# Patient Record
Sex: Male | Born: 1990 | Race: Black or African American | Hispanic: No | Marital: Single | Smoking: Current some day smoker
Health system: Southern US, Community
[De-identification: ages and names within clinical notes are randomized; demographics above are authoritative.]

## PROBLEM LIST (undated history)

## (undated) HISTORY — PX: KNEE SURGERY: SHX244

---
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
## 2017-06-14 ENCOUNTER — Encounter (HOSPITAL_COMMUNITY): Payer: Self-pay | Admitting: Emergency Medicine

## 2017-06-14 ENCOUNTER — Emergency Department (HOSPITAL_COMMUNITY): Payer: Self-pay

## 2017-06-14 ENCOUNTER — Emergency Department (HOSPITAL_COMMUNITY)
Admission: EM | Admit: 2017-06-14 | Discharge: 2017-06-15 | Disposition: A | Discharge status: Home / Self Care | Payer: Self-pay | Attending: Emergency Medicine | Admitting: Emergency Medicine

## 2017-06-14 DIAGNOSIS — M25361 Other instability, right knee: Secondary | ICD-10-CM | POA: Insufficient documentation

## 2017-06-14 DIAGNOSIS — F17200 Nicotine dependence, unspecified, uncomplicated: Secondary | ICD-10-CM | POA: Insufficient documentation

## 2017-06-14 NOTE — Discharge Instructions (Signed)
Please read and follow all provided instructions.  You have been seen today for right knee instability  Tests performed today include: An x-ray of the affected area - does NOT show any broken bones or dislocations.  Your exam did not show injury to your ACL, PCL, MCL, LCL, or meniscus.  Vital signs. See below for your results today.   Home care instructions: -- *PRICE in the first 24-48 hours after injury: Protect (with brace, splint, sling), if given by your provider Rest Ice- Do not apply ice pack directly to your skin, place towel or similar between your skin and ice/ice pack. Apply ice for 20 min, then remove for 40 min while awake Compression- Wear brace, elastic bandage, splint as directed by your provider Elevate affected extremity above the level of your heart when not walking around for the first 24-48 hours   Use Ibuprofen (Motrin/Advil)  every 6 hours as needed for pain (do not exceed max dose in 24 hours, )  Follow-up instructions: Please follow-up with your primary care provider or the provided orthopedic physician (bone specialist) if you continue to have significant pain in 1 week. In this case you may have a more severe injury that requires further care.   Return instructions:  Please return if your toes or feet are numb or tingling, appear gray or blue, or you have severe pain (also elevate the leg and loosen splint or wrap if you were given one) Please return to the Emergency Department if you experience worsening symptoms.  Please return if you have any other emergent concerns. Additional Information:  Your vital signs today were: BP (!) 148/96 (BP Location: Right Arm)    Pulse 79    Resp 16    Ht  (1.778 m)    Wt 127 kg (280 lb)    SpO2 97%    BMI 40.18 kg/m  If your blood pressure (BP) was elevated above 135/85 this visit, please have this repeated by your doctor within one month. ---------------

## 2017-06-14 NOTE — ED Provider Notes (Signed)
[facility]-EMERGENCY DEPT Provider Note   CSN: [id number] Arrival date & time: 06/14/17  2033     History   Chief Complaint Chief Complaint  Patient presents with  . Knee Pain  . Knee Injury    HPI Kevin Mason is a 26 y.o. male who presents to the emergency department today for 1 week of right knee pain. The patient states that he was "falling around" and jumped backwards last week and felt a small pop in his right knee. He felt the knee has been unstable and like it was "sliding" ever since especially with walking upstairs. He says this same sensation happened to him when he tore his anterior cruciate ligament of the left knee previously while playing football. The patient is not in any current pain, just notes the instability. He has not taken anything for this. He denies any numbness, tingling, weakness of the lower extremity or difficulty with gait.    HPI  History reviewed. No pertinent past medical history.  There are no active problems to display for this patient.   Past Surgical History:  Procedure Laterality Date  . KNEE SURGERY Left        Home Medications    Prior to Admission medications   Not on File    Family History History reviewed. No pertinent family history.  Social History Social History  Substance Use Topics  . Smoking status: Current Some Day Smoker  . Smokeless tobacco: Never Used  . Alcohol use No     Allergies   Shrimp [shellfish allergy]   Review of Systems Review of Systems  Constitutional: Negative for fever.  Musculoskeletal: Negative for gait problem and joint swelling.       Instability  Skin: Negative for color change.  Neurological: Negative for weakness and numbness.     Physical Exam Updated Vital Signs BP (!) 148/96 (BP Location: Right Arm)   Pulse 79   Resp 16   Ht  (1.778 m)   Wt 127 kg (280 lb)   SpO2 97%   BMI 40.18 kg/m   Physical Exam  Constitutional: He appears well-developed and  well-nourished.  HENT:  Head: Normocephalic and atraumatic.  Right Ear: External ear normal.  Left Ear: External ear normal.  Eyes: Conjunctivae are normal. Right eye exhibits no discharge. Left eye exhibits no discharge. No scleral icterus.  Cardiovascular:  Pulses:      Dorsalis pedis pulses are 2+ on the right side, and 2+ on the left side.       Posterior tibial pulses are 2+ on the right side, and 2+ on the left side.  Pulmonary/Chest: Effort normal. No respiratory distress.  Musculoskeletal:  Appearance normal. No obvious deformity. No skin swelling, erythema, heat, fluctuance or break of the skin. No TTP. Active and passive flexion and extension intact without pain or crepitus. Negative Lachman's test. Negative anterior/poster drawer bilaterally. Negative ballottement test. Negative McMurray's test. No varus or valgus laxity or locking. No TTP of knees or ankles. Compartments soft. Neurovascularly intact distally to site of injury.   Neurological: He is alert. No sensory deficit. Gait normal.  Skin: Skin is warm and dry. No erythema. No pallor.  Psychiatric: He has a normal mood and affect.  Nursing note and vitals reviewed.    ED Treatments / Results  Labs (all labs ordered are listed, but only abnormal results are displayed) Labs Reviewed - No data to display  EKG  EKG Interpretation None  Radiology Dg Knee Complete 4 Views Right  Result Date: 06/14/2017 CLINICAL DATA:  Twisted the right knee, pain EXAM: RIGHT KNEE - COMPLETE 4+ VIEW COMPARISON:  None. FINDINGS: No evidence of fracture, dislocation, or joint effusion. No evidence of arthropathy or other focal bone abnormality. Soft tissues are unremarkable. IMPRESSION: Negative. Electronically Signed   By: Jasmine Pang M.D.   On: 06/14/2017 22:24    Procedures Procedures (including critical care time)  Medications Ordered in ED Medications - No data to display   Initial Impression / Assessment and Plan /  ED Course  I have reviewed the triage vital signs and the nursing notes.  Pertinent labs & imaging results that were available during my care of the patient were reviewed by me and considered in my medical decision making (see chart for details).     Patient here for 1 week of right knee instability. Patient X-Ray negative for obvious fracture or dislocation. No obvious ligamentous injury with focus on the anterior cruciate ligament. No evidence of meniscus injury. Patient is pain-free in the department. I'll provide him with a knee sleeve and advised to follow up with orthopedics if symptoms persist for possibility of missed fracture diagnosis. Conservative therapy recommended and discussed. Patient will be dc home & is agreeable with above plan.   Final Clinical Impressions(s) / ED Diagnoses   Final diagnoses:  Knee instability, right    New Prescriptions New Prescriptions   No medications on file     Princella Pellegrini 06/14/17 2354    Alvira Monday, MD 06/15/17 [phone]

## 2017-06-14 NOTE — ED Triage Notes (Signed)
Patient arrives with complaint of right knee pain. States onset 1 week ago after jumping backwards. States it he felt a small pop and then his knee felt unstable like it was sliding. The knee was then unstable and he fell over. States he has history of previous ACL injury to left knee and this feel similar.

## 2017-06-15 NOTE — ED Notes (Signed)
Ortho tech called for knee sleeve.  

## 2017-08-04 ENCOUNTER — Other Ambulatory Visit: Payer: Self-pay

## 2017-08-04 ENCOUNTER — Emergency Department
Admission: EM | Admit: 2017-08-04 | Discharge: 2017-08-05 | Disposition: A | Discharge status: Home / Self Care | Payer: Self-pay | Attending: Emergency Medicine | Admitting: Emergency Medicine

## 2017-08-04 DIAGNOSIS — S61217A Laceration without foreign body of left little finger without damage to nail, initial encounter: Secondary | ICD-10-CM | POA: Insufficient documentation

## 2017-08-04 DIAGNOSIS — Y999 Unspecified external cause status: Secondary | ICD-10-CM | POA: Insufficient documentation

## 2017-08-04 DIAGNOSIS — Y929 Unspecified place or not applicable: Secondary | ICD-10-CM | POA: Insufficient documentation

## 2017-08-04 DIAGNOSIS — W268XXA Contact with other sharp object(s), not elsewhere classified, initial encounter: Secondary | ICD-10-CM | POA: Insufficient documentation

## 2017-08-04 DIAGNOSIS — Y939 Activity, unspecified: Secondary | ICD-10-CM | POA: Insufficient documentation

## 2017-08-04 DIAGNOSIS — F17200 Nicotine dependence, unspecified, uncomplicated: Secondary | ICD-10-CM | POA: Insufficient documentation

## 2017-08-04 DIAGNOSIS — S61012A Laceration without foreign body of left thumb without damage to nail, initial encounter: Secondary | ICD-10-CM | POA: Insufficient documentation

## 2017-08-04 MED ORDER — IBUPROFEN 600 MG PO TABS: 600.0000 mg | ORAL_TABLET | Freq: Three times a day (TID) | ORAL | 0 refills | Status: AC | PRN

## 2017-08-04 MED ORDER — LIDOCAINE HCL (PF) 1 % IJ SOLN
INTRAMUSCULAR | Status: AC
  Filled 2017-08-04: qty 5

## 2017-08-04 MED ORDER — TRAMADOL HCL 50 MG PO TABS: 50.0000 mg | ORAL_TABLET | Freq: Four times a day (QID) | ORAL | 0 refills | Status: AC | PRN

## 2017-08-04 MED ORDER — IBUPROFEN 800 MG PO TABS
800.0000 mg | ORAL_TABLET | Freq: Once | ORAL | Status: AC
  Administered 2017-08-04: 800 mg via ORAL
  Filled 2017-08-04: qty 1

## 2017-08-04 MED ORDER — BACITRACIN ZINC 500 UNIT/GM EX OINT
TOPICAL_OINTMENT | Freq: Once | CUTANEOUS | Status: AC
  Administered 2017-08-04: 15:00:00 via TOPICAL
  Filled 2017-08-04: qty 0.9

## 2017-08-04 NOTE — ED Notes (Signed)
Blood draw completed in triage for officers

## 2017-08-04 NOTE — ED Notes (Signed)
Pt left hand soaking in betadine and saline solution per PA instructions
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
## 2017-08-04 NOTE — Discharge Instructions (Addendum)
° °  Follow  discharge Instructions and have sutures removed in 10 days. May take ibuprofen for pain.

## 2017-08-14 ENCOUNTER — Emergency Department
Admission: EM | Admit: 2017-08-14 | Discharge: 2017-08-14 | Disposition: A | Discharge status: Home / Self Care | Payer: Self-pay | Attending: Emergency Medicine | Admitting: Emergency Medicine

## 2017-08-14 ENCOUNTER — Other Ambulatory Visit: Payer: Self-pay

## 2017-08-14 ENCOUNTER — Encounter: Payer: Self-pay | Admitting: Emergency Medicine

## 2017-08-14 DIAGNOSIS — Z4802 Encounter for removal of sutures: Secondary | ICD-10-CM

## 2017-08-14 DIAGNOSIS — S61012D Laceration without foreign body of left thumb without damage to nail, subsequent encounter: Secondary | ICD-10-CM | POA: Insufficient documentation

## 2017-08-14 DIAGNOSIS — F17200 Nicotine dependence, unspecified, uncomplicated: Secondary | ICD-10-CM | POA: Insufficient documentation

## 2017-08-14 DIAGNOSIS — X58XXXD Exposure to other specified factors, subsequent encounter: Secondary | ICD-10-CM | POA: Insufficient documentation

## 2017-08-14 NOTE — Discharge Instructions (Signed)
Keep the wound clean, dry, and covered.  °

## 2017-08-14 NOTE — ED Notes (Signed)
Pt here for suture removal x10 days ago to RT thumb and pinky finger. No drainage or swelling noted.

## 2017-08-14 NOTE — ED Triage Notes (Signed)
Patient ambulatory to triage with steady gait, without difficulty or distress noted; pt reports here for stitch removal to left thumb

## 2017-08-14 NOTE — ED Notes (Signed)
Sutures removed at this time, wounds clean and dry . No bleeding or signs of infection. Wrapped with gauze at this time to keep covered. Pt informed to watch for signs of infections

## 2017-08-14 NOTE — ED Triage Notes (Signed)
Arrives for suture removal.  Wound clean and dry.  Well approximated.

## 2017-08-15 NOTE — ED Provider Notes (Signed)
[facility] Emergency Department Provider Note ____________________________________________  Time seen: 2002  I have reviewed the triage vital signs and the nursing notes.  HISTORY  Chief Complaint  Suture / Staple Removal  HPI Kevin Mason is a 26 y.o. male presented to the ED for suture removal.  Patient was seen in the ED 10 days prior for a laceration to his left thumb and left pinky.  He denies any interim complaints.  History reviewed. No pertinent past medical history.  There are no active problems to display for this patient.  Past Surgical History:  Procedure Laterality Date  . KNEE SURGERY Left     Prior to Admission medications   Medication Sig Start Date End Date Taking? Authorizing Provider  ibuprofen (ADVIL,MOTRIN) 600 MG tablet Take 1 tablet (600 mg total) every 8 (eight) hours as needed by mouth. 08/04/17   Joni ReiningSmith, Ronald K, PA-C  traMADol (ULTRAM) 50 MG tablet Take 1 tablet (50 mg total) every 6 (six) hours as needed by mouth for moderate pain. 08/04/17   Joni ReiningSmith, Ronald K, PA-C   Allergies Shrimp [shellfish allergy]  No family history on file.  Social History Social History   Tobacco Use  . Smoking status: Current Some Day Smoker  . Smokeless tobacco: Never Used  Substance Use Topics  . Alcohol use: No  . Drug use: No    Review of Systems  Constitutional: Negative for fever. Skin: Negative for rash. Finger lacerations s/p suture repair Neurological: Negative for headaches, focal weakness or numbness. ____________________________________________  PHYSICAL EXAM:  VITAL SIGNS: ED Triage Vitals  Enc Vitals Group     BP 08/14/17 1912 140/77     Pulse Rate 08/14/17 1912 79     Resp 08/14/17 1912 20     Temp 08/14/17 1912 97.9 F (36.6 C)     Temp Source 08/14/17 1912 Oral     SpO2 08/14/17 1912 98 %     Weight --      Height --      Head Circumference --      Peak Flow --      Pain Score 08/14/17 1942 0     Pain Loc  --      Pain Edu? --      Excl. in GC? --     Constitutional: Alert and oriented. Well appearing and in no distress. Head: Normocephalic and atraumatic. Cardiovascular:  Normal distal pulses. Respiratory: Normal respiratory effort.  Musculoskeletal: Normal composite fist. Nontender with normal range of motion in all extremities.  Neurologic:  Normal gait without ataxia. Normal speech and language. No gross focal neurologic deficits are appreciated. Skin:  Skin is warm, dry and intact. No rash noted. ____________________________________________  PROCEDURES  .Suture Removal Date/Time: 08/15/2017 12:30 AM Performed by: Loye, SwazilandJordan E, RN Authorized by: Lissa HoardMenshew, Caramia Boutin V Bacon, PA-C   Consent:    Consent obtained:  Verbal   Consent given by:  Patient Location:    Location:  Upper extremity   Upper extremity location:  Hand   Hand location:  L thumb and L small finger Procedure details:    Wound appearance:  No signs of infection, good wound healing and clean   Number of sutures removed:  7 Post-procedure details:    Post-removal:  Band-Aid applied   Patient tolerance of procedure:  Tolerated well, no immediate complications  ___________________________________________  INITIAL IMPRESSION / ASSESSMENT AND PLAN / ED COURSE  Patient with a ED encounter for suture removals.  Patient  had sutures placed to the left thumb and small finger.  Wounds are well-healed without any signs of infection.  Sutures are removed without difficulty and dry dressings are placed.  Patient is given wound care instructions and will follow up with his primary care provider as needed. ____________________________________________  FINAL CLINICAL IMPRESSION(S) / ED DIAGNOSES  Final diagnoses:  Encounter for removal of sutures      Karmen StabsMenshew, Charlesetta IvoryJenise V Bacon, PA-C 08/15/17 0031    Minna AntisPaduchowski, Kevin, MD 08/18/17 1443

## 2018-04-03 IMAGING — DX DG KNEE COMPLETE 4+V*R*
4 series · 4 of 4 positions shown · non-contrast
Comparison: None.

CLINICAL DATA: Twisted the right knee, pain

EXAM:
RIGHT KNEE - COMPLETE 4+ VIEW

[knee ap]
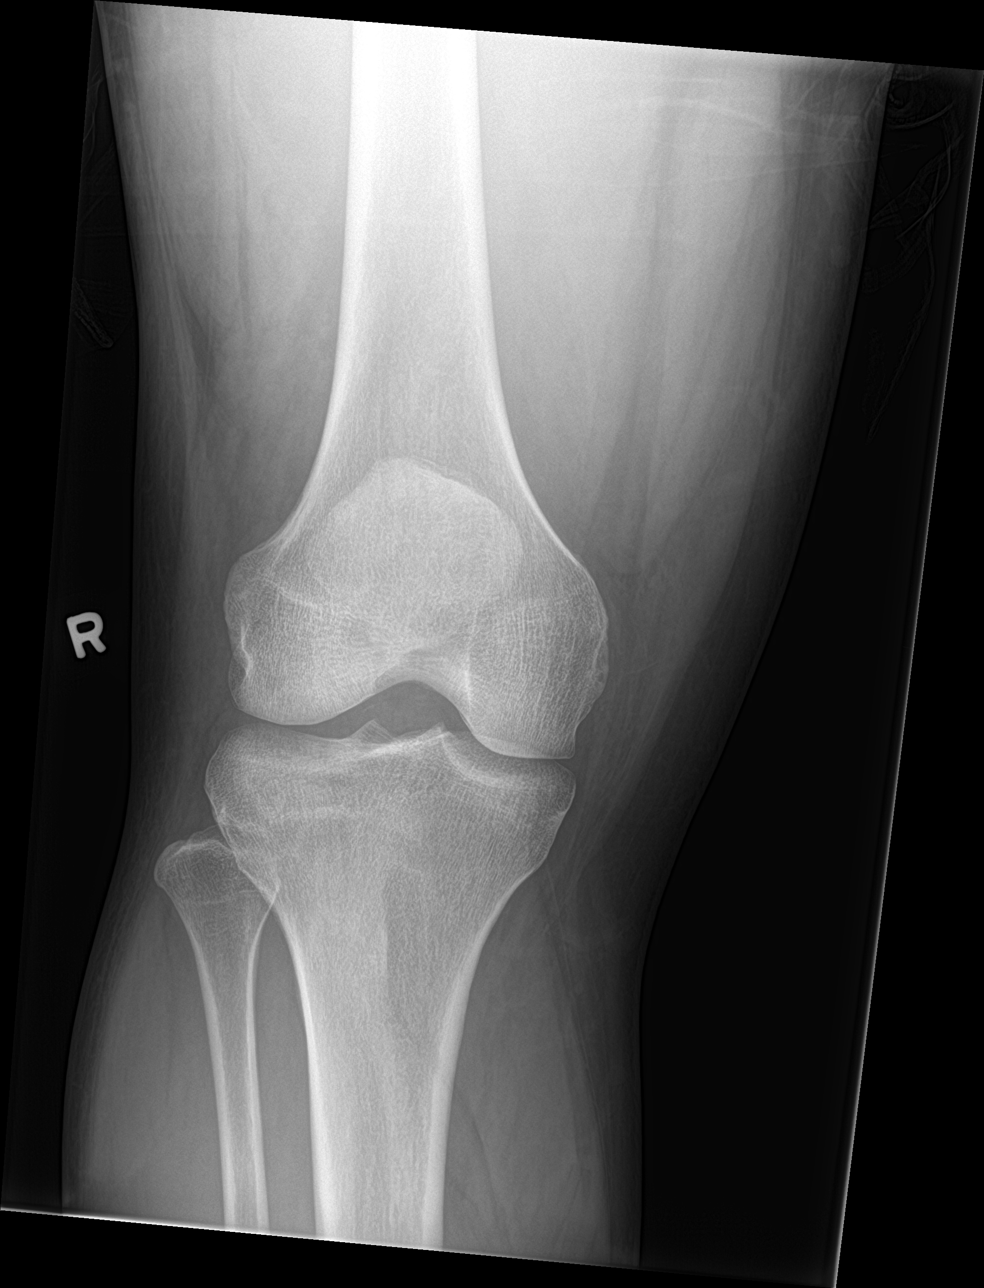

[knee lat]
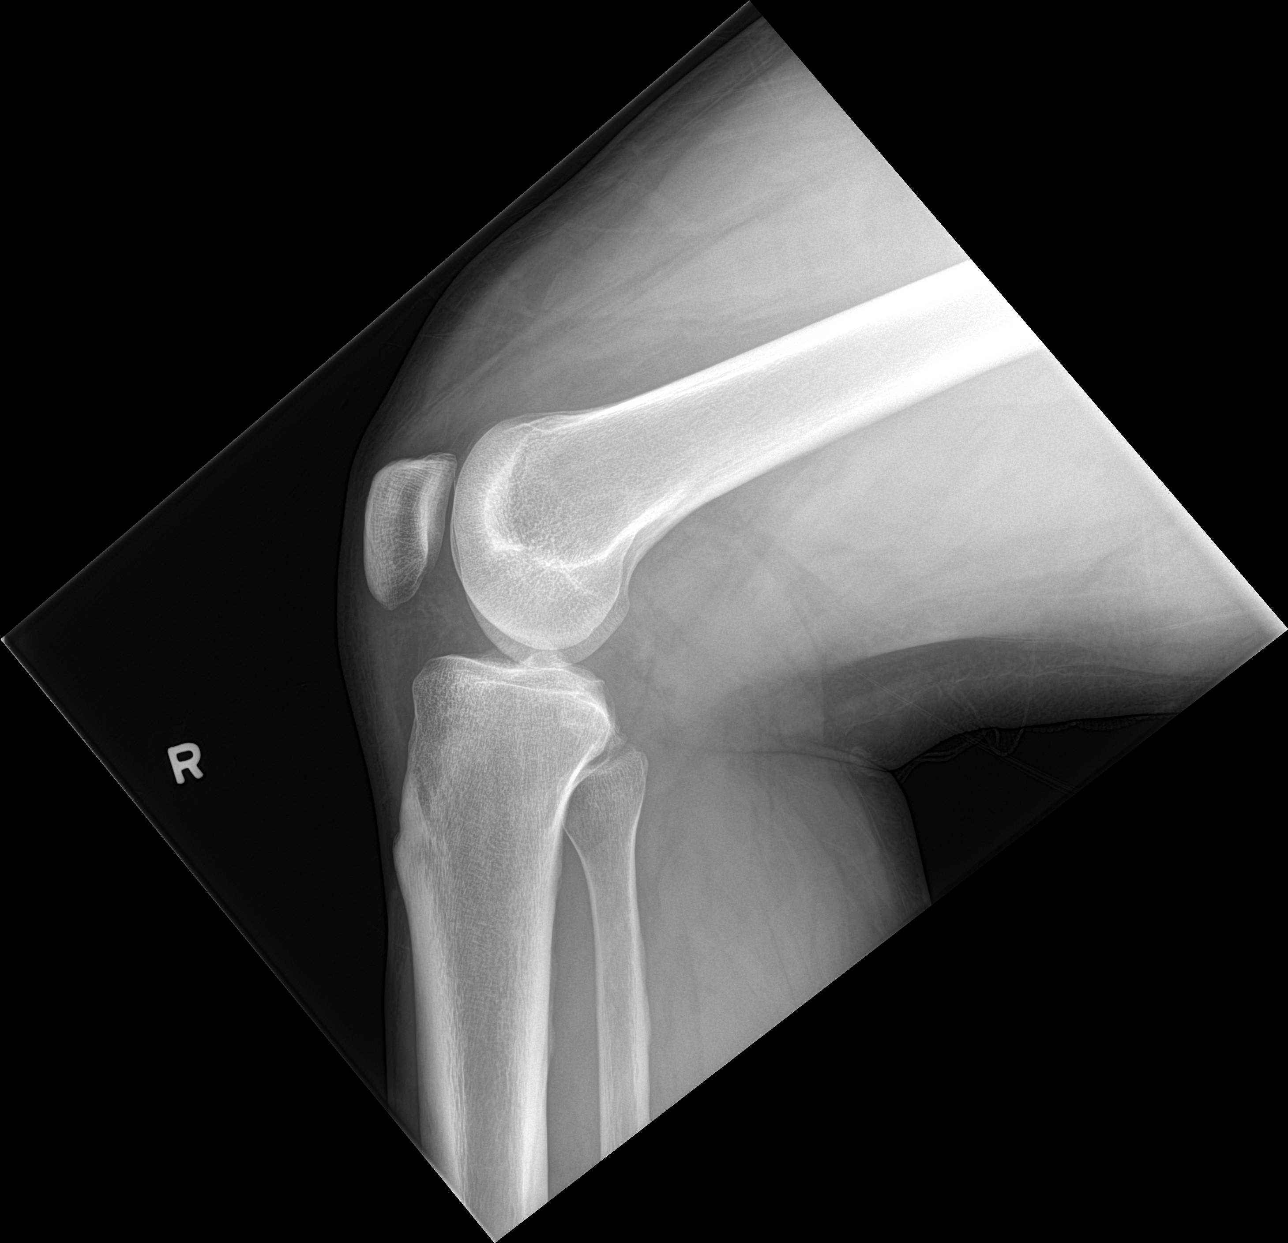

[knee obl (1 of 2)]
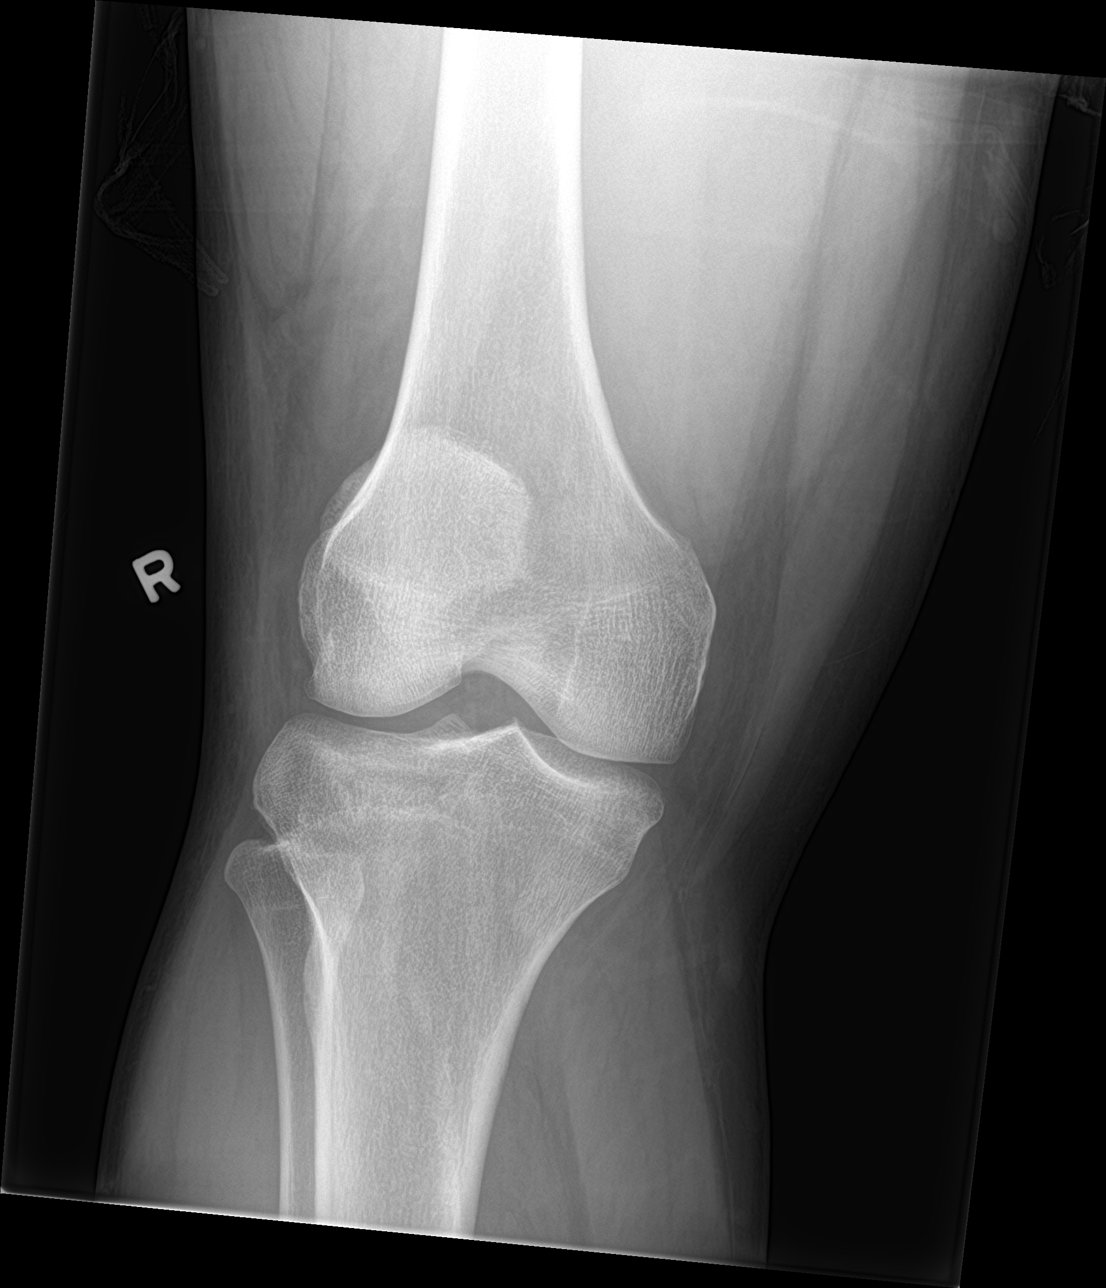

[knee obl (2 of 2)]
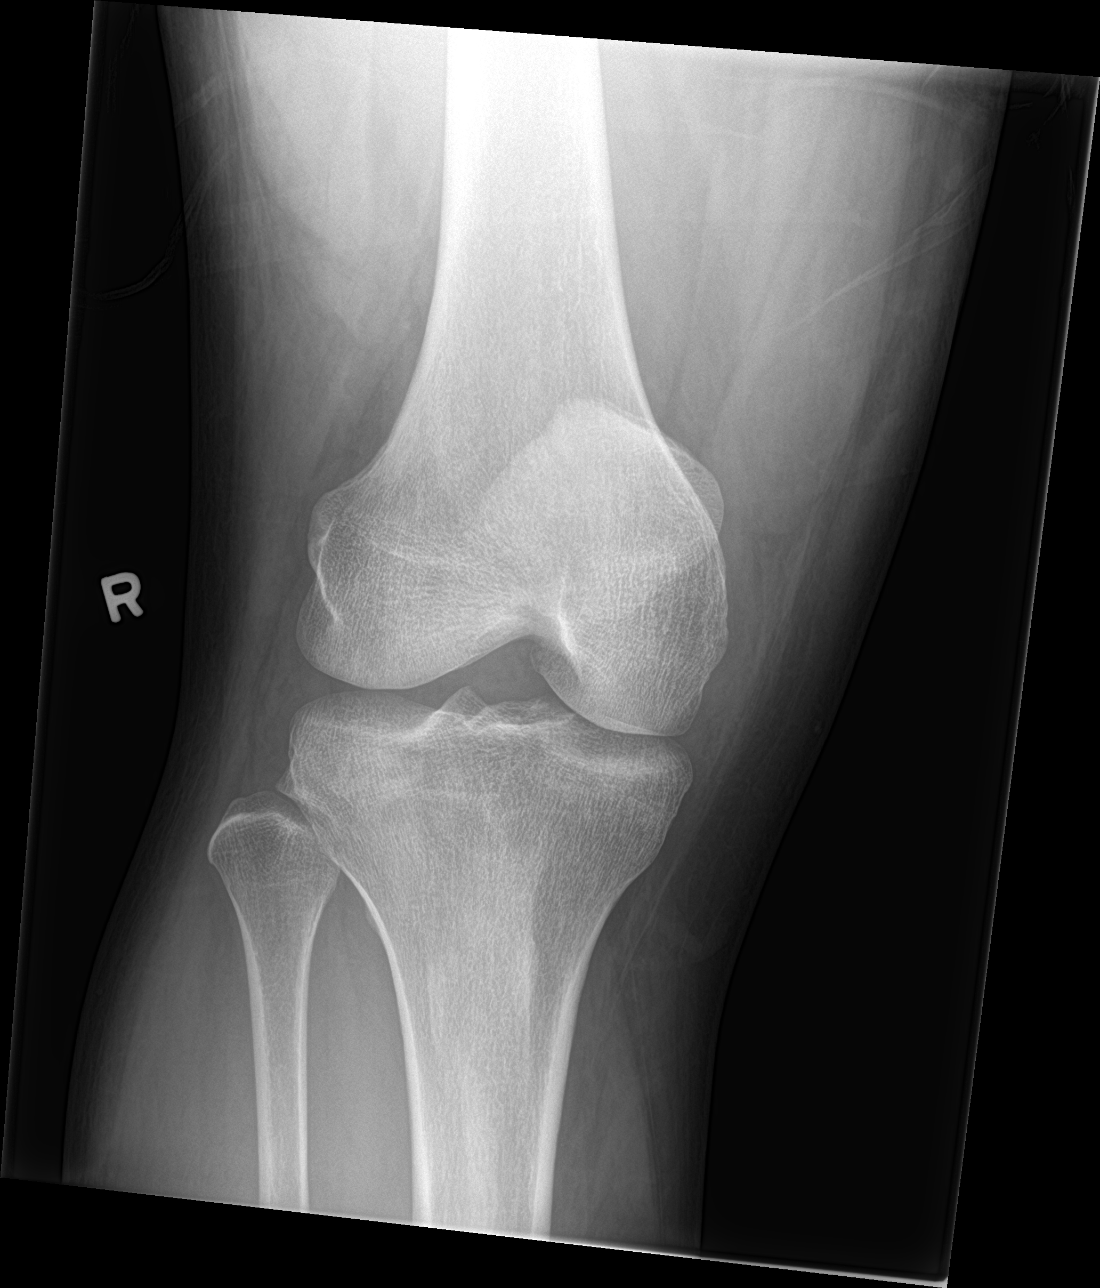

[4 of 4 positions shown; findings below may reference images not displayed]

FINDINGS: No evidence of fracture, dislocation, or joint effusion. No evidence
of arthropathy or other focal bone abnormality. Soft tissues are
unremarkable.
IMPRESSION: Negative.

## 2019-07-29 ENCOUNTER — Ambulatory Visit: Payer: Self-pay | Admitting: Physician Assistant

## 2019-07-29 ENCOUNTER — Other Ambulatory Visit: Payer: Self-pay

## 2019-07-29 DIAGNOSIS — Z202 Contact with and (suspected) exposure to infections with a predominantly sexual mode of transmission: Secondary | ICD-10-CM

## 2019-07-29 DIAGNOSIS — Z113 Encounter for screening for infections with a predominantly sexual mode of transmission: Secondary | ICD-10-CM

## 2019-07-29 MED ORDER — AZITHROMYCIN 500 MG PO TABS
1000.0000 mg | ORAL_TABLET | Freq: Once | ORAL | Status: AC
  Administered 2019-07-29: 16:00:00 1000 mg via ORAL

## 2019-07-30 ENCOUNTER — Encounter: Payer: Self-pay | Admitting: Physician Assistant

## 2019-07-30 NOTE — Progress Notes (Signed)
    STI clinic/screening visit  Subjective:  Kevin Mason is a 28 y.o. male being seen today for an STI screening visit. The patient reports they do have symptoms.  Patient has the following medical conditions:  There are no active problems to display for this patient.    Chief Complaint  Patient presents with  . SEXUALLY TRANSMITTED DISEASE    HPI  Patient reports that he is a contact to Chlamydia.  States that he has noticed a tingling inside his penis for 2-3 days.  Patient had initially wanted screening exam, testing and blood work but has changed his mind and only wants treatment now.  States that he received a call from a family member and has to go to help them with something.  See flowsheet for further details and programmatic requirements.    The following portions of the patient's history were reviewed and updated as appropriate: allergies, current medications, past medical history, past social history, past surgical history and problem list.  Objective:  There were no vitals filed for this visit.  Physical Exam Constitutional:      General: He is not in acute distress.    Appearance: Normal appearance.  HENT:     Head: Normocephalic and atraumatic.  Pulmonary:     Effort: Pulmonary effort is normal.  Neurological:     Mental Status: He is alert and oriented to person, place, and time.  Psychiatric:        Mood and Affect: Mood normal.        Behavior: Behavior normal.        Thought Content: Thought content normal.        Judgment: Judgment normal.       Assessment and Plan:  JACODY BENEKE is a 28 y.o. male presenting to the [facility] Department for STI screening  1. Screening for STD (sexually transmitted disease) Patient having symptoms.  Declines screening exam and blood work today due to time constraints. Rec condoms with all sex. Counseled patient to RTC if needed if tx does not clear symptoms or if he wants to RTC for a  screening.  2. Chlamydia contact Will treat with Azithromycin 1 g po DOT today.   No sex for 7 days and until after partner/s complete treatment. RTC if vomits < 2 hours after taking medicine for re-treatment. - azithromycin (ZITHROMAX) tablet 1,000 mg     No follow-ups on file.  No future appointments.  Jerene Dilling, PA

## 2019-08-26 DEATH — deceased
